# Patient Record
Sex: Male | Born: 1975 | Race: Black or African American | Hispanic: No | Marital: Married | Smoking: Current every day smoker
Health system: Southern US, Community
[De-identification: ages and names within clinical notes are randomized; demographics above are authoritative.]

## PROBLEM LIST (undated history)

## (undated) ENCOUNTER — Emergency Department: Admission: EM | Payer: No Typology Code available for payment source | Source: Home / Self Care

## (undated) DIAGNOSIS — F1410 Cocaine abuse, uncomplicated: Secondary | ICD-10-CM

---

## 2019-01-31 ENCOUNTER — Encounter: Payer: Self-pay | Admitting: Emergency Medicine

## 2019-01-31 ENCOUNTER — Emergency Department: Payer: No Typology Code available for payment source

## 2019-01-31 ENCOUNTER — Emergency Department
Admission: EM | Admit: 2019-01-31 | Discharge: 2019-01-31 | Disposition: A | Discharge status: Home / Self Care | Payer: No Typology Code available for payment source | Attending: Emergency Medicine | Admitting: Emergency Medicine

## 2019-01-31 ENCOUNTER — Other Ambulatory Visit: Payer: Self-pay

## 2019-01-31 DIAGNOSIS — Y9389 Activity, other specified: Secondary | ICD-10-CM | POA: Diagnosis not present

## 2019-01-31 DIAGNOSIS — S161XXA Strain of muscle, fascia and tendon at neck level, initial encounter: Secondary | ICD-10-CM | POA: Diagnosis not present

## 2019-01-31 DIAGNOSIS — Y999 Unspecified external cause status: Secondary | ICD-10-CM | POA: Diagnosis not present

## 2019-01-31 DIAGNOSIS — Y92410 Unspecified street and highway as the place of occurrence of the external cause: Secondary | ICD-10-CM | POA: Diagnosis not present

## 2019-01-31 DIAGNOSIS — S199XXA Unspecified injury of neck, initial encounter: Secondary | ICD-10-CM | POA: Diagnosis present

## 2019-01-31 MED ORDER — MELOXICAM 15 MG PO TABS: 15.0000 mg | ORAL_TABLET | Freq: Every day | ORAL | 2 refills | Status: AC

## 2019-01-31 MED ORDER — METHOCARBAMOL 500 MG PO TABS: 500.0000 mg | ORAL_TABLET | Freq: Four times a day (QID) | ORAL | 0 refills | Status: DC
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
## 2019-01-31 NOTE — Discharge Instructions (Signed)
Follow-up with your regular doctor or orthopedics if not better in 1 week.  Return emergency department worsening.  Take the meloxicam and Robaxin as prescribed.  He may also take Tylenol if needed.  Apply ice to all areas that hurt.  We do not want to in a narcotic at this time as it may mask any worsening symptoms.  You develop abdominal pain or chest pain you must return to the emergency department.
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
## 2020-01-12 ENCOUNTER — Other Ambulatory Visit: Payer: Self-pay

## 2020-01-12 ENCOUNTER — Emergency Department
Admission: EM | Admit: 2020-01-12 | Discharge: 2020-01-12 | Disposition: A | Discharge status: Home / Self Care | Payer: Self-pay | Attending: Emergency Medicine | Admitting: Emergency Medicine

## 2020-01-12 DIAGNOSIS — F17200 Nicotine dependence, unspecified, uncomplicated: Secondary | ICD-10-CM | POA: Diagnosis not present

## 2020-01-12 DIAGNOSIS — N454 Abscess of epididymis or testis: Secondary | ICD-10-CM | POA: Insufficient documentation

## 2020-01-12 DIAGNOSIS — N492 Inflammatory disorders of scrotum: Secondary | ICD-10-CM

## 2020-01-12 MED ORDER — SULFAMETHOXAZOLE-TRIMETHOPRIM 800-160 MG PO TABS: 1.0000 | ORAL_TABLET | Freq: Two times a day (BID) | ORAL | 0 refills | Status: DC

## 2020-01-12 MED ORDER — TAMSULOSIN HCL 0.4 MG PO CAPS: 0.4000 mg | ORAL_CAPSULE | Freq: Every day | ORAL | 0 refills | Status: AC

## 2020-01-12 NOTE — ED Triage Notes (Signed)
Pt reports abscess on scrotum that ruptured 2 days ago. Yellow drainage from area at time of rupture. Pt states region is painful.  nad noted at this time

## 2020-01-16 NOTE — ED Provider Notes (Signed)
[facility] Emergency Department Provider Note  ____________________________________________  Time seen: Approximately 1:25 PM  I have reviewed the triage vital signs and the nursing notes.   HISTORY  Chief Complaint Abscess   HPI Dylan Freeman is a 44 y.o. male who presents to the emergency department for treatment and evaluation of abscess to the scrotum. He states that the area drained spontaneously but is still very painful. No history of skin infections. No fever. He also states that he has a kidney stone and would like medication "to help it pass."    History reviewed. No pertinent past medical history.  There are no problems to display for this patient.   History reviewed. No pertinent surgical history.  Prior to Admission medications   Medication Sig Start Date End Date Taking? Authorizing Provider  meloxicam (MOBIC) 15 MG tablet Take 1 tablet (15 mg total) by mouth daily. 01/31/19 01/31/20  Fisher, Roselyn Bering, PA-C  methocarbamol (ROBAXIN) 500 MG tablet Take 1 tablet (500 mg total) by mouth 4 (four) times daily. 01/31/19   Fisher, Roselyn Bering, PA-C  sulfamethoxazole-trimethoprim (BACTRIM DS) 800-160 MG tablet Take 1 tablet by mouth 2 (two) times daily. 01/12/20   Nohemi Nicklaus, Rulon Eisenmenger B, FNP  tamsulosin (FLOMAX) 0.4 MG CAPS capsule Take 1 capsule (0.4 mg total) by mouth daily. 01/12/20   Chinita Pester, FNP    Allergies Penicillins  History reviewed. No pertinent family history.  Social History Social History   Tobacco Use   Smoking status: Current Every Day Smoker   Smokeless tobacco: Never Used  Substance Use Topics   Alcohol use: Yes   Drug use: Not Currently    Review of Systems  Constitutional: Negative for fever. Respiratory: Negative for cough or shortness of breath.  Musculoskeletal: Negative for myalgias Skin: Positive for left side scrotal pain Neurological: Negative for numbness or  paresthesias. ____________________________________________   PHYSICAL EXAM:  VITAL SIGNS: ED Triage Vitals  Enc Vitals Group     BP 01/12/20 1021 122/71     Pulse Rate 01/12/20 1021 80     Resp 01/12/20 1021 16     Temp 01/12/20 1021 98.9 F (37.2 C)     Temp Source 01/12/20 1149 Oral     SpO2 01/12/20 1021 98 %     Weight 01/12/20 1023 200 lb (90.7 kg)     Height 01/12/20 1023 5\' 11"  (1.803 m)     Head Circumference --      Peak Flow --      Pain Score 01/12/20 1022 6     Pain Loc --      Pain Edu? --      Excl. in [place]? --      Constitutional: Well appearing. Eyes: Conjunctivae are clear without discharge or drainage. Nose: No rhinorrhea noted. Mouth/Throat: Airway is patent.  Neck: No stridor. Unrestricted range of motion observed. Cardiovascular: Capillary refill is <3 seconds.  Respiratory: Respirations are even and unlabored.. Musculoskeletal: Unrestricted range of motion observed. Neurologic: Awake, alert, and oriented x 4.  Skin: Left side scrotal wall mildly erythematous and tender with open area draining yellow fluid. No fluctuance or induration.  ____________________________________________   LABS (all labs ordered are listed, but only abnormal results are displayed)  Labs Reviewed - No data to display ____________________________________________  EKG  Not indicated. ____________________________________________  RADIOLOGY  Not indicated. ____________________________________________   PROCEDURES  Procedures ____________________________________________   INITIAL IMPRESSION / ASSESSMENT AND PLAN / ED COURSE  Dylan Freeman is a 44  y.o. male presents to the ER for scrotal pain and abscess. Area appears to have nearly completely drained. No fluctuance or induration. He will be treated with Bactrim. He will also be given Flomax which may help pass kidney stone. He was encouraged to stay well hydrated and follow up with PCP for symptoms of concern. He  is to return to the ER for symptoms that change or worsen.   Medications - No data to display   Pertinent labs & imaging results that were available during my care of the patient were reviewed by me and considered in my medical decision making (see chart for details).  ____________________________________________   FINAL CLINICAL IMPRESSION(S) / ED DIAGNOSES  Final diagnoses:  Abscess of scrotal wall    ED Discharge Orders         Ordered    sulfamethoxazole-trimethoprim (BACTRIM DS) 800-160 MG tablet  2 times daily        01/12/20 1110    tamsulosin (FLOMAX) 0.4 MG CAPS capsule  Daily        01/12/20 1110           Note:  This document was prepared using Dragon voice recognition software and may include unintentional dictation errors.   Chinita Pester, FNP 01/16/20 1337    Gilles Chiquito, MD 01/19/20 2005

## 2020-11-04 ENCOUNTER — Other Ambulatory Visit: Payer: Self-pay

## 2020-11-04 ENCOUNTER — Observation Stay
Admission: EM | Admit: 2020-11-04 | Discharge: 2020-11-05 | Disposition: A | Discharge status: Home / Self Care | Payer: Self-pay | Attending: Hospitalist | Admitting: Hospitalist

## 2020-11-04 ENCOUNTER — Encounter
Admission: EM | Disposition: A | Discharge status: Home / Self Care | Payer: Self-pay | Source: Home / Self Care | Attending: Hospitalist

## 2020-11-04 DIAGNOSIS — R079 Chest pain, unspecified: Secondary | ICD-10-CM | POA: Diagnosis present

## 2020-11-04 DIAGNOSIS — F1410 Cocaine abuse, uncomplicated: Secondary | ICD-10-CM | POA: Diagnosis present

## 2020-11-04 DIAGNOSIS — Z20822 Contact with and (suspected) exposure to covid-19: Secondary | ICD-10-CM | POA: Insufficient documentation

## 2020-11-04 DIAGNOSIS — F17290 Nicotine dependence, other tobacco product, uncomplicated: Secondary | ICD-10-CM | POA: Insufficient documentation

## 2020-11-04 DIAGNOSIS — R0789 Other chest pain: Principal | ICD-10-CM | POA: Insufficient documentation

## 2020-11-04 DIAGNOSIS — Z79899 Other long term (current) drug therapy: Secondary | ICD-10-CM | POA: Insufficient documentation

## 2020-11-04 DIAGNOSIS — R072 Precordial pain: Secondary | ICD-10-CM

## 2020-11-04 HISTORY — PX: LEFT HEART CATH AND CORONARY ANGIOGRAPHY: CATH118249

## 2020-11-04 HISTORY — DX: Cocaine abuse, uncomplicated: F14.10

## 2020-11-04 HISTORY — PX: CORONARY/GRAFT ACUTE MI REVASCULARIZATION: CATH118305

## 2020-11-04 LAB — TROPONIN I (HIGH SENSITIVITY)
Troponin I (High Sensitivity): 4 ng/L (ref <18)
Troponin I (High Sensitivity): 5 ng/L (ref <18)

## 2020-11-04 LAB — CBC WITH DIFFERENTIAL/PLATELET
Abs Immature Granulocytes: 0.03 10*3/uL (ref 0.00–0.07)
Basophils Absolute: 0.1 10*3/uL (ref 0.0–0.1)
Basophils Relative: 1 %
Eosinophils Absolute: 0.4 10*3/uL (ref 0.0–0.5)
Eosinophils Relative: 5 %
HCT: 39.9 % (ref 39.0–52.0)
Hemoglobin: 13.3 g/dL (ref 13.0–17.0)
Immature Granulocytes: 0 %
Lymphocytes Relative: 30 %
Lymphs Abs: 2.4 10*3/uL (ref 0.7–4.0)
MCH: 32.1 pg (ref 26.0–34.0)
MCHC: 33.3 g/dL (ref 30.0–36.0)
MCV: 96.4 fL (ref 80.0–100.0)
Monocytes Absolute: 0.6 10*3/uL (ref 0.1–1.0)
Monocytes Relative: 7 %
Neutro Abs: 4.6 10*3/uL (ref 1.7–7.7)
Neutrophils Relative %: 57 %
Platelets: 181 10*3/uL (ref 150–400)
RBC: 4.14 MIL/uL — ABNORMAL LOW (ref 4.22–5.81)
RDW: 12.5 % (ref 11.5–15.5)
WBC: 8.1 10*3/uL (ref 4.0–10.5)
nRBC: 0 % (ref 0.0–0.2)

## 2020-11-04 LAB — COMPREHENSIVE METABOLIC PANEL
ALT: 12 U/L (ref 0–44)
AST: 22 U/L (ref 15–41)
Albumin: 4.2 g/dL (ref 3.5–5.0)
Alkaline Phosphatase: 64 U/L (ref 38–126)
Anion gap: 8 (ref 5–15)
BUN: 10 mg/dL (ref 6–20)
CO2: 24 mmol/L (ref 22–32)
Calcium: 8.7 mg/dL — ABNORMAL LOW (ref 8.9–10.3)
Chloride: 103 mmol/L (ref 98–111)
Creatinine, Ser: 1.15 mg/dL (ref 0.61–1.24)
GFR, Estimated: >60 mL/min (ref >60)
Glucose, Bld: 132 mg/dL — ABNORMAL HIGH (ref 70–99)
Potassium: 3.3 mmol/L — ABNORMAL LOW (ref 3.5–5.1)
Sodium: 135 mmol/L (ref 135–145)
Total Bilirubin: 1.6 mg/dL — ABNORMAL HIGH (ref 0.3–1.2)
Total Protein: 7.4 g/dL (ref 6.5–8.1)

## 2020-11-04 LAB — LIPID PANEL
Cholesterol: 193 mg/dL (ref 0–200)
HDL: 42 mg/dL (ref >40)
LDL Cholesterol: 136 mg/dL — ABNORMAL HIGH (ref 0–99)
Total CHOL/HDL Ratio: 4.6 RATIO
Triglycerides: 75 mg/dL (ref <150)
VLDL: 15 mg/dL (ref 0–40)

## 2020-11-04 LAB — RESP PANEL BY RT-PCR (FLU A&B, COVID) ARPGX2
Influenza A by PCR: NEGATIVE
Influenza B by PCR: NEGATIVE
SARS Coronavirus 2 by RT PCR: NEGATIVE

## 2020-11-04 LAB — PROTIME-INR
INR: 1.1 (ref 0.8–1.2)
Prothrombin Time: 14.4 s (ref 11.4–15.2)

## 2020-11-04 LAB — APTT: aPTT: 131 s — ABNORMAL HIGH (ref 24–36)

## 2020-11-04 SURGERY — CORONARY/GRAFT ACUTE MI REVASCULARIZATION
Anesthesia: Moderate Sedation

## 2020-11-04 MED ORDER — LIDOCAINE HCL (PF) 1 % IJ SOLN
INTRAMUSCULAR | Status: DC | PRN
  Administered 2020-11-04: 2 mL

## 2020-11-04 MED ORDER — IOHEXOL 300 MG/ML  SOLN
INTRAMUSCULAR | Status: DC | PRN
  Administered 2020-11-04: 80 mL

## 2020-11-04 MED ORDER — SODIUM CHLORIDE 0.9 % IV SOLN: INTRAVENOUS | Status: DC

## 2020-11-04 MED ORDER — LIDOCAINE HCL 1 % IJ SOLN
INTRAMUSCULAR | Status: AC
  Filled 2020-11-04: qty 20

## 2020-11-04 MED ORDER — FENTANYL CITRATE (PF) 100 MCG/2ML IJ SOLN
INTRAMUSCULAR | Status: AC
  Filled 2020-11-04: qty 2

## 2020-11-04 MED ORDER — FENTANYL CITRATE (PF) 100 MCG/2ML IJ SOLN
INTRAMUSCULAR | Status: DC | PRN
  Administered 2020-11-04: 50 ug via INTRAVENOUS

## 2020-11-04 MED ORDER — VERAPAMIL HCL 2.5 MG/ML IV SOLN
INTRAVENOUS | Status: DC | PRN
  Administered 2020-11-04: 2.5 mg via INTRAVENOUS

## 2020-11-04 MED ORDER — ASPIRIN 81 MG PO CHEW: 324.0000 mg | CHEWABLE_TABLET | Freq: Once | ORAL | Status: DC

## 2020-11-04 MED ORDER — HEPARIN (PORCINE) IN NACL 2000-0.9 UNIT/L-% IV SOLN
INTRAVENOUS | Status: DC | PRN
  Administered 2020-11-04: 1000 mL

## 2020-11-04 MED ORDER — HEPARIN SODIUM (PORCINE) 1000 UNIT/ML IJ SOLN
INTRAMUSCULAR | Status: DC | PRN
  Administered 2020-11-04: 5000 [IU] via INTRAVENOUS

## 2020-11-04 MED ORDER — MIDAZOLAM HCL 2 MG/2ML IJ SOLN
INTRAMUSCULAR | Status: AC
  Filled 2020-11-04: qty 2

## 2020-11-04 MED ORDER — HEPARIN SODIUM (PORCINE) 1000 UNIT/ML IJ SOLN
INTRAMUSCULAR | Status: AC
  Filled 2020-11-04: qty 1

## 2020-11-04 MED ORDER — VERAPAMIL HCL 2.5 MG/ML IV SOLN
INTRAVENOUS | Status: AC
  Filled 2020-11-04: qty 2

## 2020-11-04 MED ORDER — HEPARIN (PORCINE) IN NACL 1000-0.9 UT/500ML-% IV SOLN
INTRAVENOUS | Status: AC
  Filled 2020-11-04: qty 1000

## 2020-11-04 MED ORDER — MIDAZOLAM HCL 2 MG/2ML IJ SOLN
INTRAMUSCULAR | Status: DC | PRN
  Administered 2020-11-04: 1 mg via INTRAVENOUS

## 2020-11-04 SURGICAL SUPPLY — 16 items
CATH INFINITI 5 FR JL3.5 (CATHETERS) ×2 IMPLANT
CATH INFINITI 5FR ANG PIGTAIL (CATHETERS) ×2 IMPLANT
CATH INFINITI JR4 5F (CATHETERS) ×2 IMPLANT
DEVICE RAD TR BAND REGULAR (VASCULAR PRODUCTS) ×2 IMPLANT
DRAPE BRACHIAL (DRAPES) ×2 IMPLANT
GLIDESHEATH SLEND SS 6F .021 (SHEATH) ×2 IMPLANT
GUIDEWIRE INQWIRE 1.5J.035X260 (WIRE) ×1 IMPLANT
INQWIRE 1.5J .035X260CM (WIRE) ×2
KIT ENCORE 26 ADVANTAGE (KITS) IMPLANT
KIT SYRINGE INJ CVI SPIKEX1 (MISCELLANEOUS) ×2 IMPLANT
PACK CARDIAC CATH (CUSTOM PROCEDURE TRAY) ×2 IMPLANT
PROTECTION STATION PRESSURIZED (MISCELLANEOUS) ×2
SET ATX SIMPLICITY (MISCELLANEOUS) ×2 IMPLANT
STATION PROTECTION PRESSURIZED (MISCELLANEOUS) ×1 IMPLANT
TUBING CIL FLEX 10 FLL-RA (TUBING) ×2 IMPLANT
WIRE ASAHI PROWATER 180CM (WIRE) IMPLANT

## 2020-11-04 NOTE — Progress Notes (Signed)
Chaplain Maggie responded to Code Stemi to ED2. No family present. Spiritual and social support available per on call chaplain at [phone].

## 2020-11-04 NOTE — ED Provider Notes (Signed)
[facility] Emergency Department Provider Note  ____________________________________________   I have reviewed the triage vital signs and the nursing notes.   HISTORY  Chief Complaint Code STEMI   History limited by: Not Limited   HPI Dylan Freeman is a 45 y.o. male who presents to the emergency department today via EMS as emergency traffic for code STEMI.  Patient was at work today when he started developing chest tightness.  Located in his left center chest.  He states that it did radiate to his left arm.  He had some associated shortness of breath and felt hot as well.  He says he has had similar symptoms occasionally in the past.  He has not been evaluated for this in the past.  Patient states that he has been told he has slight high blood pressure and diabetes issues but that with dietary change he would not need to be on medication.  Says that his mother had heart disease although is unclear what that history was.  He states he does smoke and drink.  Records reviewed.    There are no problems to display for this patient.   History reviewed. No pertinent surgical history.  Prior to Admission medications   Medication Sig Start Date End Date Taking? Authorizing Provider  methocarbamol (ROBAXIN) 500 MG tablet Take 1 tablet (500 mg total) by mouth 4 (four) times daily. 01/31/19   Fisher, Roselyn Bering, PA-C  sulfamethoxazole-trimethoprim (BACTRIM DS) 800-160 MG tablet Take 1 tablet by mouth 2 (two) times daily. 01/12/20   Triplett, Rulon Eisenmenger B, FNP  tamsulosin (FLOMAX) 0.4 MG CAPS capsule Take 1 capsule (0.4 mg total) by mouth daily. 01/12/20   Chinita Pester, FNP    Allergies Penicillins  History reviewed. No pertinent family history.  Social History Social History   Tobacco Use   Smoking status: Every Day   Smokeless tobacco: Never  Substance Use Topics   Alcohol use: Yes   Drug use: Yes    Types: Marijuana    Review of Systems Constitutional: No  fever/chills Eyes: No visual changes. ENT: No sore throat. Cardiovascular: Positive for chest tightness. Respiratory: Positive for shortness of breath. Gastrointestinal: No abdominal pain.  No nausea, no vomiting.  No diarrhea.   Genitourinary: Negative for dysuria. Musculoskeletal: Negative for back pain. Skin: Negative for rash. Neurological: Negative for headaches, focal weakness or numbness.  ____________________________________________   PHYSICAL EXAM:  VITAL SIGNS: ED Triage Vitals  Enc Vitals Group     BP 11/04/20 2059 126/82     Pulse Rate 11/04/20 2059 75     Resp 11/04/20 2059 10     Temp 11/04/20 2106 98.7 F (37.1 C)     Temp Source 11/04/20 2106 Oral     SpO2 11/04/20 2059 99 %     Weight 11/04/20 2105 211 lb 1.6 oz (95.8 kg)     Height 11/04/20 2104 5\' 11"  (1.803 m)     Head Circumference --      Peak Flow --      Pain Score 11/04/20 2103 5   Constitutional: Alert and oriented.  Eyes: Conjunctivae are normal.  ENT      Head: Normocephalic and atraumatic.      Nose: No congestion/rhinnorhea.      Mouth/Throat: Mucous membranes are moist.      Neck: No stridor. Hematological/Lymphatic/Immunilogical: No cervical lymphadenopathy. Cardiovascular: Normal rate, regular rhythm.  No murmurs, rubs, or gallops.  Respiratory: Normal respiratory effort without tachypnea nor retractions. Breath sounds  are clear and equal bilaterally. No wheezes/rales/rhonchi. Gastrointestinal: Soft and non tender. No rebound. No guarding.  Genitourinary: Deferred Musculoskeletal: Normal range of motion in all extremities. No lower extremity edema. Neurologic:  Normal speech and language. No gross focal neurologic deficits are appreciated.  Skin:  Skin is warm, dry and intact. No rash noted. Psychiatric: Mood and affect are normal. Speech and behavior are normal. Patient exhibits appropriate insight and judgment.  ____________________________________________    LABS (pertinent  positives/negatives)  Pending at time of admission  ____________________________________________   EKG  I, Phineas Semen, attending physician, personally viewed and interpreted this EKG  EKG Time: 2103 Rate: 75 Rhythm: sinus rhythm Axis: normal Intervals: qtc 429 QRS: narrow ST changes: st elevation II, III, aVF Impression: abnormal ekg   ____________________________________________    RADIOLOGY  None  ____________________________________________   PROCEDURES  Procedures  CRITICAL CARE Performed by: Phineas Semen   Total critical care time: 15 minutes  Critical care time was exclusive of separately billable procedures and treating other patients.  Critical care was necessary to treat or prevent imminent or life-threatening deterioration.  Critical care was time spent personally by me on the following activities: development of treatment plan with patient and/or surrogate as well as nursing, discussions with consultants, evaluation of patient's response to treatment, examination of patient, obtaining history from patient or surrogate, ordering and performing treatments and interventions, ordering and review of laboratory studies, ordering and review of radiographic studies, pulse oximetry and re-evaluation of patient's condition.  ____________________________________________   INITIAL IMPRESSION / ASSESSMENT AND PLAN / ED COURSE  Pertinent labs & imaging results that were available during my care of the patient were reviewed by me and considered in my medical decision making (see chart for details).   Patient presented to the emergency department today because of concerns for chest pain.  Patient did come in and set a code STEMI.  Dr. Cassie Freer with cardiology did evaluate the patient here in the emergency department.  Did take the patient emergently to Cath Lab.   ____________________________________________   FINAL CLINICAL IMPRESSION(S) / ED  DIAGNOSES  Final diagnoses:  Chest pain of uncertain etiology     Note: This dictation was prepared with Dragon dictation. Any transcriptional errors that result from this process are unintentional     Phineas Semen, MD 11/04/20 2313

## 2020-11-04 NOTE — Plan of Care (Signed)
  Problem: Clinical Measurements: Goal: Diagnostic test results will improve Outcome: Progressing Goal: Cardiovascular complication will be avoided Outcome: Progressing   Problem: Pain Managment: Goal: General experience of comfort will improve Outcome: Progressing   

## 2020-11-04 NOTE — ED Triage Notes (Signed)
Pt BIBA from home c/o sudden onset midsternal chest pain at 2015 today. Pt describes as pressure 8/10. EMS gave 4 ASA and nitro at 2032 and 500cc NS. Pt felt some relief but pain it still present on arrival to ED. Pt A+O x4 on arrival .

## 2020-11-04 NOTE — Consult Note (Signed)
[facility] Cardiology  CARDIOLOGY CONSULT NOTE  Patient ID: Dylan Freeman MRN: [id number] DOB/AGE: [birth year] 45 y.o.  Admit date: 11/04/2020 Referring Physician Derrill Kay Primary Physician  Primary Cardiologist  Reason for Consultation chest pain  HPI: 45 year old gentleman referred for evaluation of chest pain and ECG worrisome for inferior and anterolateral ST elevation MI.  Patient was usual state of health until earlier this evening when he developed substernal chest pain.  EMS was called and initial EKG revealed ST elevations in the inferior and anterolateral leads.  In the ED, patient was still complaining of chest pain.  Repeat ECG again revealed ST elevation in the inferior and anterolateral leads.  Patient underwent emergent cardiac catheterization which revealed normal coronary anatomy and normal left ventricular function.  Review of systems complete and found to be negative unless listed above     History reviewed. No pertinent past medical history.  History reviewed. No pertinent surgical history.  Medications Prior to Admission  Medication Sig Dispense Refill Last Dose   methocarbamol (ROBAXIN) 500 MG tablet Take 1 tablet (500 mg total) by mouth 4 (four) times daily. 28 tablet 0    sulfamethoxazole-trimethoprim (BACTRIM DS) 800-160 MG tablet Take 1 tablet by mouth 2 (two) times daily. 20 tablet 0    tamsulosin (FLOMAX) 0.4 MG CAPS capsule Take 1 capsule (0.4 mg total) by mouth daily. 14 capsule 0    Social History   Socioeconomic History   Marital status: Married    Spouse name: Not on file   Number of children: Not on file   Years of education: Not on file   Highest education level: Not on file  Occupational History   Not on file  Tobacco Use   Smoking status: Every Day   Smokeless tobacco: Never  Substance and Sexual Activity   Alcohol use: Yes   Drug use: Yes    Types: Marijuana   Sexual activity: Not on file  Other Topics Concern   Not on file  Social History  Narrative   Not on file   Social Determinants of Health   Financial Resource Strain: Not on file  Food Insecurity: Not on file  Transportation Needs: Not on file  Physical Activity: Not on file  Stress: Not on file  Social Connections: Not on file  Intimate Partner Violence: Not on file    History reviewed. No pertinent family history.    Review of systems complete and found to be negative unless listed above      PHYSICAL EXAM  General: Well developed, well nourished, in no acute distress HEENT:  Normocephalic and atramatic Neck:  No JVD.  Lungs: Clear bilaterally to auscultation and percussion. Heart: HRRR . Normal S1 and S2 without gallops or murmurs.  Abdomen: Bowel sounds are positive, abdomen soft and non-tender  Msk:  Back normal, normal gait. Normal strength and tone for age. Extremities: No clubbing, cyanosis or edema.   Neuro: Alert and oriented X 3. Psych:  Good affect, responds appropriately  Labs:  No results found for: WBC, HGB, HCT, MCV, PLT No results for input(s): NA, K, CL, CO2, BUN, CREATININE, CALCIUM, PROT, BILITOT, ALKPHOS, ALT, AST, GLUCOSE in the last 168 hours.  Invalid input(s): LABALBU No results found for: CKTOTAL, CKMB, CKMBINDEX, TROPONINI No results found for: CHOL No results found for: HDL No results found for: LDLCALC No results found for: TRIG No results found for: CHOLHDL No results found for: LDLDIRECT    Radiology: CARDIAC CATHETERIZATION  Result Date: 11/04/2020   The  left ventricular systolic function is normal.   The left ventricular ejection fraction is 55-65% by visual estimate. 1.  Normal coronary anatomy 2.  Normal left ventricular function    EKG: Sinus rhythm with ST elevation in leads II, III and aVF, 1 and aVL, V3 through V6  ASSESSMENT AND PLAN:   1.  Chest pain, with abnormal ECG, emergent cardiac catheterization reveals normal coronary anatomy and normal left ventricular function 2.  Tobacco  abuse  Recommendations  1.  Start low-dose aspirin 81 mg daily 2.  Chest x-ray 3.  Observe at least overnight for recurrent chest pain which is likely noncardiac in origin 4.  Strongly advised patient to stop smoking  Signed: Marcina Millard MD,PhD, [facility] 11/04/2020, 10:00 PM

## 2020-11-04 NOTE — H&P (Signed)
History and Physical    Dylan Freeman ZOX:[id number] DOB: 1975/08/29 DOA: 11/04/2020  PCP: Patient, No Pcp Per (Inactive)   Patient coming from: cath lab  I have personally briefly reviewed patient's old medical records in [facility]  Chief Complaint: Chest pain  HPI: Dylan Freeman is a 45 y.o. male  with no significant past medical history who arrived by EMS as a code STEMI after developing severe retrosternal and precordial chest tightness radiating to his left arm associated with shortness of breath and diaphoresis.   He was taken to the Cath Lab by cardiologist Dr. Darrold Junker with finding of clean coronaries.  Patient had an episode of similar chest pain in 2015 with negative work-up in the ED.  Patient continues to have chest discomfort described as pressure, nonradiating 2-3 out of 10 but without shortness of breath.  He denies leg pain or swelling, recent long car travel. Patient states he drinks alcohol about 4 days out of the week and has been advised by his wife to cut back.  Also does marijuana.  Has snorted cocaine occasionally, last time was 2 to 3 weeks ago.  Post-cath vitals: BP 132/80, pulse 76 O2 sat 100% on room air  Labs: Still pending from arrival as they had to be recollected COVID and flu negative  Cardiac cath report: Normal coronary anatomy with normal left ventricular function  Review of Systems: As per HPI otherwise all other systems on review of systems negative.    History reviewed. No pertinent past medical history.  History reviewed. No pertinent surgical history.   reports that he has been smoking cigars. He has never used smokeless tobacco. He reports current alcohol use of about 16.0 standard drinks per week. He reports current drug use. Drugs: Marijuana and Cocaine.  Allergies  Allergen Reactions   Penicillins Rash    Family History  Problem Relation Age of Onset   Heart attack Mother       Prior to Admission medications   Medication Sig  Start Date End Date Taking? Authorizing Provider  methocarbamol (ROBAXIN) 500 MG tablet Take 1 tablet (500 mg total) by mouth 4 (four) times daily. 01/31/19   Fisher, Roselyn Bering, PA-C  sulfamethoxazole-trimethoprim (BACTRIM DS) 800-160 MG tablet Take 1 tablet by mouth 2 (two) times daily. 01/12/20   Triplett, Rulon Eisenmenger B, FNP  tamsulosin (FLOMAX) 0.4 MG CAPS capsule Take 1 capsule (0.4 mg total) by mouth daily. 01/12/20   Chinita Pester, FNP    Physical Exam: Vitals:   11/04/20 2104 11/04/20 2105 11/04/20 2106 11/04/20 2123  BP:      Pulse:      Resp:      Temp:   98.7 F (37.1 C)   TempSrc:   Oral   SpO2:    95%  Weight:  95.8 kg    Height: 5\' 11"  (1.803 m)        Vitals:   11/04/20 2104 11/04/20 2105 11/04/20 2106 11/04/20 2123  BP:      Pulse:      Resp:      Temp:   98.7 F (37.1 C)   TempSrc:   Oral   SpO2:    95%  Weight:  95.8 kg    Height: 5\' 11"  (1.803 m)         Constitutional: Somewhat ill-appearing, oriented x 3 . Not in any apparent distress HEENT:      Head: Normocephalic and atraumatic.         Eyes:  PERLA, EOMI, Conjunctivae are normal. Sclera is non-icteric.       Mouth/Throat: Mucous membranes are moist.       Neck: Supple with no signs of meningismus. Cardiovascular: Regular rate and rhythm. No murmurs, gallops, or rubs. 2+ symmetrical distal pulses are present . No JVD. No LE edema Respiratory: Respiratory effort normal .Lungs sounds clear bilaterally. No wheezes, crackles, or rhonchi.  Gastrointestinal: Soft, non tender, and non distended with positive bowel sounds.  Genitourinary: No CVA tenderness. Musculoskeletal: Nontender with normal range of motion in all extremities. No cyanosis, or erythema of extremities. Neurologic:  Face is symmetric. Moving all extremities. No gross focal neurologic deficits . Skin: Skin is warm, dry.  No rash or ulcers Psychiatric: Has a depressed affect   Labs on Admission: I have personally reviewed following labs and  imaging studies  CBC: No results for input(s): WBC, NEUTROABS, HGB, HCT, MCV, PLT in the last 168 hours. Basic Metabolic Panel: Recent Labs  Lab 11/04/20 2108  NA 135  K 3.3*  CL 103  CO2 24  GLUCOSE 132*  BUN 10  CREATININE 1.15  CALCIUM 8.7*   GFR: Estimated Creatinine Clearance: 97.8 mL/min (by C-G formula based on SCr of 1.15 mg/dL). Liver Function Tests: Recent Labs  Lab 11/04/20 2108  AST 22  ALT 12  ALKPHOS 64  BILITOT 1.6*  PROT 7.4  ALBUMIN 4.2   No results for input(s): LIPASE, AMYLASE in the last 168 hours. No results for input(s): AMMONIA in the last 168 hours. Coagulation Profile: No results for input(s): INR, PROTIME in the last 168 hours. Cardiac Enzymes: No results for input(s): CKTOTAL, CKMB, CKMBINDEX, TROPONINI in the last 168 hours. BNP (last 3 results) No results for input(s): PROBNP in the last 8760 hours. HbA1C: No results for input(s): HGBA1C in the last 72 hours. CBG: No results for input(s): GLUCAP in the last 168 hours. Lipid Profile: Recent Labs    11/04/20 2108  CHOL 193  HDL 42  LDLCALC 136*  TRIG 75  CHOLHDL 4.6   Thyroid Function Tests: No results for input(s): TSH, T4TOTAL, FREET4, T3FREE, THYROIDAB in the last 72 hours. Anemia Panel: No results for input(s): VITAMINB12, FOLATE, FERRITIN, TIBC, IRON, RETICCTPCT in the last 72 hours. Urine analysis: No results found for: COLORURINE, APPEARANCEUR, LABSPEC, PHURINE, GLUCOSEU, HGBUR, BILIRUBINUR, KETONESUR, PROTEINUR, UROBILINOGEN, NITRITE, LEUKOCYTESUR  Radiological Exams on Admission: CARDIAC CATHETERIZATION  Result Date: 11/04/2020   The left ventricular systolic function is normal.   The left ventricular ejection fraction is 55-65% by visual estimate. 1.  Normal coronary anatomy 2.  Normal left ventricular function     Assessment/Plan 45 year old male with no significant past medical history presenting as code STEMI, ruled out with cardiac cath that showed normal  coronaries    Chest pain, noncardiac - Negative cardiac cath - Blood work returned unremarkable with troponin 4-5,Potassium 3.3, glucose 132, LDL 136 - Etiology includes musculoskeletal, GI,  - D-dimer and chest x-ray - Observe overnight  Nicotine dependence - Counseled on tobacco cessation  Alcohol use - Counseled on abstinence.  States wife has been talking to him about cutting back and he has been trying - CIWA withdrawal protocol  Marijuana use - Counseled on cutting back/cessation  History of cocaine use - Counseled on complete abstinence    DVT prophylaxis: Lovenox  Code Status: full code  Family Communication:  none  Disposition Plan: Back to previous home environment Consults called: none  Status:observation    Andris Baumann MD [facility]     11/04/2020, 10:10 PM

## 2020-11-05 ENCOUNTER — Observation Stay: Payer: Self-pay

## 2020-11-05 ENCOUNTER — Encounter: Payer: Self-pay | Admitting: Cardiology

## 2020-11-05 LAB — URINE DRUG SCREEN, QUALITATIVE (ARMC ONLY)
Amphetamines, Ur Screen: NOT DETECTED
Barbiturates, Ur Screen: NOT DETECTED
Benzodiazepine, Ur Scrn: POSITIVE — AB
Cannabinoid 50 Ng, Ur ~~LOC~~: POSITIVE — AB
Cocaine Metabolite,Ur ~~LOC~~: POSITIVE — AB
MDMA (Ecstasy)Ur Screen: NOT DETECTED
Methadone Scn, Ur: NOT DETECTED
Opiate, Ur Screen: NOT DETECTED
Phencyclidine (PCP) Ur S: NOT DETECTED
Tricyclic, Ur Screen: NOT DETECTED

## 2020-11-05 LAB — D-DIMER, QUANTITATIVE: D-Dimer, Quant: 0.29 ug/mL-FEU (ref 0.00–0.50)

## 2020-11-05 LAB — HIV ANTIBODY (ROUTINE TESTING W REFLEX): HIV Screen 4th Generation wRfx: NONREACTIVE

## 2020-11-05 MED ORDER — LABETALOL HCL 5 MG/ML IV SOLN: 10.0000 mg | INTRAVENOUS | Status: AC | PRN

## 2020-11-05 MED ORDER — SODIUM CHLORIDE 0.9 % IV SOLN: 250.0000 mL | INTRAVENOUS | Status: DC | PRN

## 2020-11-05 MED ORDER — SODIUM CHLORIDE 0.9 % WEIGHT BASED INFUSION
1.0000 mL/kg/h | INTRAVENOUS | Status: DC
  Administered 2020-11-05: 1 mL/kg/h via INTRAVENOUS

## 2020-11-05 MED ORDER — SODIUM CHLORIDE 0.9% FLUSH
3.0000 mL | Freq: Two times a day (BID) | INTRAVENOUS | Status: DC
  Administered 2020-11-05: 3 mL via INTRAVENOUS

## 2020-11-05 MED ORDER — ACETAMINOPHEN 325 MG PO TABS: 650.0000 mg | ORAL_TABLET | ORAL | Status: DC | PRN

## 2020-11-05 MED ORDER — ACETAMINOPHEN 650 MG RE SUPP: 650.0000 mg | Freq: Four times a day (QID) | RECTAL | Status: DC | PRN

## 2020-11-05 MED ORDER — ONDANSETRON HCL 4 MG/2ML IJ SOLN: 4.0000 mg | Freq: Four times a day (QID) | INTRAMUSCULAR | Status: DC | PRN

## 2020-11-05 MED ORDER — ASPIRIN 81 MG PO CHEW
81.0000 mg | CHEWABLE_TABLET | Freq: Every day | ORAL | Status: DC
  Administered 2020-11-05: 81 mg via ORAL
  Filled 2020-11-05: qty 1

## 2020-11-05 MED ORDER — HYDRALAZINE HCL 20 MG/ML IJ SOLN: 10.0000 mg | INTRAMUSCULAR | Status: AC | PRN

## 2020-11-05 MED ORDER — MORPHINE SULFATE (PF) 2 MG/ML IV SOLN
2.0000 mg | INTRAVENOUS | Status: DC | PRN
Start: 2020-11-05 — End: 2020-11-05

## 2020-11-05 MED ORDER — SODIUM CHLORIDE 0.9% FLUSH: 3.0000 mL | INTRAVENOUS | Status: DC | PRN

## 2020-11-05 MED ORDER — ONDANSETRON HCL 4 MG PO TABS: 4.0000 mg | ORAL_TABLET | Freq: Four times a day (QID) | ORAL | Status: DC | PRN

## 2020-11-05 MED ORDER — BUTALBITAL-APAP-CAFFEINE 50-325-40 MG PO TABS
2.0000 | ORAL_TABLET | Freq: Once | ORAL | Status: DC
  Filled 2020-11-05: qty 2

## 2020-11-05 MED ORDER — ACETAMINOPHEN 325 MG PO TABS: 650.0000 mg | ORAL_TABLET | Freq: Four times a day (QID) | ORAL | Status: DC | PRN

## 2020-11-05 MED ORDER — ENOXAPARIN SODIUM 40 MG/0.4ML IJ SOSY
40.0000 mg | PREFILLED_SYRINGE | INTRAMUSCULAR | Status: DC
  Administered 2020-11-05: 40 mg via SUBCUTANEOUS
  Filled 2020-11-05: qty 0.4

## 2020-11-05 MED ORDER — METHOCARBAMOL 500 MG PO TABS: 500.0000 mg | ORAL_TABLET | Freq: Four times a day (QID) | ORAL | 0 refills | Status: AC | PRN

## 2020-11-05 MED ORDER — ASPIRIN 81 MG PO CHEW: 81.0000 mg | CHEWABLE_TABLET | Freq: Every day | ORAL | Status: AC

## 2020-11-05 MED ORDER — HYDROCODONE-ACETAMINOPHEN 5-325 MG PO TABS: 1.0000 | ORAL_TABLET | ORAL | Status: DC | PRN

## 2020-11-05 NOTE — Discharge Summary (Signed)
Physician Discharge Summary   Dylan Freeman  male DOB: [birth year]  VHQ:[id number]  PCP: Patient, No Pcp Per (Inactive)  Admit date: 11/04/2020 Discharge date: 11/05/2020  Admitted From: home Disposition:  home Wife updated at bedside prior to discharge.  CODE STATUS: Full code  Discharge Instructions     No wound care   Complete by: As directed         Hospital Course:  For full details, please see H&P, progress notes, consult notes and ancillary notes.  Briefly,  Dylan Freeman is a 45 y.o. male  with history significant for cocaine use who arrived by EMS as a code STEMI after developing severe retrosternal and precordial chest tightness radiating to his left arm associated with shortness of breath and diaphoresis.   He was taken to the Cath Lab by cardiologist Dr. Darrold Junker with finding of clean coronaries.  Patient had an episode of similar chest pain in 2015 with negative work-up in the ED.    Patient stated he drinks alcohol about 4 days out of the week and has been advised by his wife to cut back.  Also does marijuana.  Has snorted cocaine occasionally, last time was 2 to 3 weeks ago.  Chest pain, likely 2/2 cocaine-induced vasoconstriction  History of cocaine use - Negative cardiac cath - Blood work returned unremarkable with troponin 4-5 --Pt denied recent cocaine use, however, UDS pos for cocaine. --Advised pt and wife that cocaine can cause chest pain, constructing coronary arteries, and eventually can damage the heart.   Nicotine dependence - Counseled on tobacco cessation  Alcohol use - Counseled on abstinence.    Marijuana use - Counseled on cutting back/cessation   Discharge Diagnoses:  Active Problems:   Chest pain   Discharge Instructions:  Allergies as of 11/05/2020       Reactions   Penicillins Rash        Medication List     STOP taking these medications    sulfamethoxazole-trimethoprim 800-160 MG tablet Commonly known as: BACTRIM  DS       TAKE these medications    aspirin 81 MG chewable tablet Chew 1 tablet (81 mg total) by mouth daily. Start taking on: November 06, 2020   methocarbamol 500 MG tablet Commonly known as: Robaxin Take 1 tablet (500 mg total) by mouth every 6 (six) hours as needed for muscle spasms. Home med. What changed:  when to take this reasons to take this additional instructions   tamsulosin 0.4 MG Caps capsule Commonly known as: FLOMAX Take 1 capsule (0.4 mg total) by mouth daily.          Allergies  Allergen Reactions   Penicillins Rash     The results of significant diagnostics from this hospitalization (including imaging, microbiology, ancillary and laboratory) are listed below for reference.   Consultations:   Procedures/Studies: CARDIAC CATHETERIZATION  Result Date: 11/04/2020   The left ventricular systolic function is normal.   The left ventricular ejection fraction is 55-65% by visual estimate. 1.  Normal coronary anatomy 2.  Normal left ventricular function   DG Chest Port 1 View  Result Date: 11/05/2020 CLINICAL DATA:  Chest pain EXAM: PORTABLE CHEST 1 VIEW COMPARISON:  None. FINDINGS: The heart size and mediastinal contours are within normal limits. Both lungs are clear. The visualized skeletal structures are unremarkable. IMPRESSION: No active disease. Electronically Signed   By: Deatra Robinson M.D.   On: 11/05/2020 02:33      Labs: BNP (last 3 results)  No results for input(s): BNP in the last 8760 hours. Basic Metabolic Panel: Recent Labs  Lab 11/04/20 2108  NA 135  K 3.3*  CL 103  CO2 24  GLUCOSE 132*  BUN 10  CREATININE 1.15  CALCIUM 8.7*   Liver Function Tests: Recent Labs  Lab 11/04/20 2108  AST 22  ALT 12  ALKPHOS 64  BILITOT 1.6*  PROT 7.4  ALBUMIN 4.2   No results for input(s): LIPASE, AMYLASE in the last 168 hours. No results for input(s): AMMONIA in the last 168 hours. CBC: Recent Labs  Lab 11/04/20 2320  WBC 8.1   NEUTROABS 4.6  HGB 13.3  HCT 39.9  MCV 96.4  PLT 181   Cardiac Enzymes: No results for input(s): CKTOTAL, CKMB, CKMBINDEX, TROPONINI in the last 168 hours. BNP: Invalid input(s): POCBNP CBG: No results for input(s): GLUCAP in the last 168 hours. D-Dimer Recent Labs    11/04/20 2320  DDIMER 0.29   Hgb A1c No results for input(s): HGBA1C in the last 72 hours. Lipid Profile Recent Labs    11/04/20 2108  CHOL 193  HDL 42  LDLCALC 136*  TRIG 75  CHOLHDL 4.6   Thyroid function studies No results for input(s): TSH, T4TOTAL, T3FREE, THYROIDAB in the last 72 hours.  Invalid input(s): FREET3 Anemia work up No results for input(s): VITAMINB12, FOLATE, FERRITIN, TIBC, IRON, RETICCTPCT in the last 72 hours. Urinalysis No results found for: COLORURINE, APPEARANCEUR, LABSPEC, PHURINE, GLUCOSEU, HGBUR, BILIRUBINUR, KETONESUR, PROTEINUR, UROBILINOGEN, NITRITE, LEUKOCYTESUR Sepsis Labs Invalid input(s): PROCALCITONIN,  WBC,  LACTICIDVEN Microbiology Recent Results (from the past 240 hour(s))  Resp Panel by RT-PCR (Flu A&B, Covid) Nasopharyngeal Swab     Status: None   Collection Time: 11/04/20  9:08 PM   Specimen: Nasopharyngeal Swab; Nasopharyngeal(NP) swabs in vial transport medium  Result Value Ref Range Status   SARS Coronavirus 2 by RT PCR NEGATIVE NEGATIVE Final    Comment: (NOTE) SARS-CoV-2 target nucleic acids are NOT DETECTED.  The SARS-CoV-2 RNA is generally detectable in upper respiratory specimens during the acute phase of infection. The lowest concentration of SARS-CoV-2 viral copies this assay can detect is 138 copies/mL. A negative result does not preclude SARS-Cov-2 infection and should not be used as the sole basis for treatment or other patient management decisions. A negative result may occur with  improper specimen collection/handling, submission of specimen other than nasopharyngeal swab, presence of viral mutation(s) within the areas targeted by this  assay, and inadequate number of viral copies(<138 copies/mL). A negative result must be combined with clinical observations, patient history, and epidemiological information. The expected result is Negative.  Fact Sheet for Patients:  BloggerCourse.com  Fact Sheet for Healthcare Providers:  SeriousBroker.it  This test is no t yet approved or cleared by the [place] FDA and  has been authorized for detection and/or diagnosis of SARS-CoV-2 by FDA under an Emergency Use Authorization (EUA). This EUA will remain  in effect (meaning this test can be used) for the duration of the COVID-19 declaration under Section 564(b)(1) of the Act, 21 U.S.C.section 360bbb-3(b)(1), unless the authorization is terminated  or revoked sooner.       Influenza A by PCR NEGATIVE NEGATIVE Final   Influenza B by PCR NEGATIVE NEGATIVE Final    Comment: (NOTE) The Xpert Xpress SARS-CoV-2/FLU/RSV plus assay is intended as an aid in the diagnosis of influenza from Nasopharyngeal swab specimens and should not be used as a sole basis for treatment. Nasal washings and aspirates are  unacceptable for Xpert Xpress SARS-CoV-2/FLU/RSV testing.  Fact Sheet for Patients: BloggerCourse.com  Fact Sheet for Healthcare Providers: SeriousBroker.it  This test is not yet approved or cleared by the [place] FDA and has been authorized for detection and/or diagnosis of SARS-CoV-2 by FDA under an Emergency Use Authorization (EUA). This EUA will remain in effect (meaning this test can be used) for the duration of the COVID-19 declaration under Section 564(b)(1) of the Act, 21 U.S.C. section 360bbb-3(b)(1), unless the authorization is terminated or revoked.  Performed at [facility], [address] [place], [place] [address]      Total time spend on discharging this patient, including the last patient  exam, discussing the hospital stay, instructions for ongoing care as it relates to all pertinent caregivers, as well as preparing the medical discharge records, prescriptions, and/or referrals as applicable, is 45 minutes.    Darlin Priestly, MD  [facility] 11/05/2020, 11:13 AM

## 2020-11-05 NOTE — Progress Notes (Signed)
[facility]  SUBJECTIVE: Patient laying in bed, denies chest pain or shortness of breath   Vitals:   11/04/20 2106 11/04/20 2123 11/04/20 2200 11/05/20 0111  BP:   137/83 108/75  Pulse:   69 64  Resp:   14 17  Temp: 98.7 F (37.1 C)  97.7 F (36.5 C) 97.9 F (36.6 C)  TempSrc: Oral  Oral   SpO2:  95% 100% 100%  Weight:      Height:         Intake/Output Summary (Last 24 hours) at 11/05/2020 0817 Last data filed at 11/05/2020 0130 Gross per 24 hour  Intake 120 ml  Output 500 ml  Net -380 ml      PHYSICAL EXAM  General: Well developed, well nourished, in no acute distress HEENT:  Normocephalic and atramatic Neck:  No JVD.  Lungs: Clear bilaterally to auscultation and percussion. Heart: HRRR . Normal S1 and S2 without gallops or murmurs.  Abdomen: Bowel sounds are positive, abdomen soft and non-tender  Msk:  Back normal, normal gait. Normal strength and tone for age. Extremities: No clubbing, cyanosis or edema.   Neuro: Alert and oriented X 3. Psych:  Good affect, responds appropriately   LABS: Basic Metabolic Panel: Recent Labs    11/04/20 2108  NA 135  K 3.3*  CL 103  CO2 24  GLUCOSE 132*  BUN 10  CREATININE 1.15  CALCIUM 8.7*   Liver Function Tests: Recent Labs    11/04/20 2108  AST 22  ALT 12  ALKPHOS 64  BILITOT 1.6*  PROT 7.4  ALBUMIN 4.2   No results for input(s): LIPASE, AMYLASE in the last 72 hours. CBC: Recent Labs    11/04/20 2320  WBC 8.1  NEUTROABS 4.6  HGB 13.3  HCT 39.9  MCV 96.4  PLT 181   Cardiac Enzymes: No results for input(s): CKTOTAL, CKMB, CKMBINDEX, TROPONINI in the last 72 hours. BNP: Invalid input(s): POCBNP D-Dimer: Recent Labs    11/04/20 2320  DDIMER 0.29   Hemoglobin A1C: No results for input(s): HGBA1C in the last 72 hours. Fasting Lipid Panel: Recent Labs    11/04/20 2108  CHOL 193  HDL 42  LDLCALC 136*  TRIG 75  CHOLHDL 4.6   Thyroid Function Tests: No results for input(s): TSH,  T4TOTAL, T3FREE, THYROIDAB in the last 72 hours.  Invalid input(s): FREET3 Anemia Panel: No results for input(s): VITAMINB12, FOLATE, FERRITIN, TIBC, IRON, RETICCTPCT in the last 72 hours.  CARDIAC CATHETERIZATION  Result Date: 11/04/2020   The left ventricular systolic function is normal.   The left ventricular ejection fraction is 55-65% by visual estimate. 1.  Normal coronary anatomy 2.  Normal left ventricular function   DG Chest Port 1 View  Result Date: 11/05/2020 CLINICAL DATA:  Chest pain EXAM: PORTABLE CHEST 1 VIEW COMPARISON:  None. FINDINGS: The heart size and mediastinal contours are within normal limits. Both lungs are clear. The visualized skeletal structures are unremarkable. IMPRESSION: No active disease. Electronically Signed   By: Deatra Robinson M.D.   On: 11/05/2020 02:33     Echo   TELEMETRY: Sinus rhythm at 65 bpm:  ASSESSMENT AND PLAN:  Active Problems:   Chest pain    1. Chest pain, with abnormal ECG, emergent cardiac catheterization reveals normal coronary anatomy and normal left ventricular function, high-sensitivity troponin normal x2, no recurrent chest pain 2.  Tobacco abuse 3.  Elevated blood pressure, normal today  Recommendations  1.  Agree with current therapy 2.  Continue low-dose aspirin 3.  Defer further cardiac diagnostics at this time 4.  Discharge home later today       Marcina Millard, MD, PhD, [facility] 11/05/2020 8:17 AM

## 2020-11-06 LAB — HEMOGLOBIN A1C
Hgb A1c MFr Bld: 4.9 % (ref 4.8–5.6)
Mean Plasma Glucose: 93.93 mg/dL

## 2020-11-07 ENCOUNTER — Encounter: Payer: Self-pay | Admitting: Cardiology

## 2020-11-07 LAB — GLUCOSE, CAPILLARY: Glucose-Capillary: 98 mg/dL (ref 70–99)

## 2020-11-10 ENCOUNTER — Encounter: Payer: Self-pay | Admitting: Hospitalist

## 2020-11-10 ENCOUNTER — Encounter: Payer: Self-pay | Admitting: Cardiology

## 2020-11-10 DIAGNOSIS — F1410 Cocaine abuse, uncomplicated: Secondary | ICD-10-CM | POA: Insufficient documentation

## 2021-01-10 ENCOUNTER — Encounter: Payer: Self-pay | Admitting: Emergency Medicine

## 2021-01-10 ENCOUNTER — Emergency Department: Payer: Self-pay

## 2021-01-10 DIAGNOSIS — L03314 Cellulitis of groin: Secondary | ICD-10-CM | POA: Insufficient documentation

## 2021-01-10 DIAGNOSIS — Z7982 Long term (current) use of aspirin: Secondary | ICD-10-CM | POA: Insufficient documentation

## 2021-01-10 DIAGNOSIS — Z79899 Other long term (current) drug therapy: Secondary | ICD-10-CM | POA: Insufficient documentation

## 2021-01-10 DIAGNOSIS — F17290 Nicotine dependence, other tobacco product, uncomplicated: Secondary | ICD-10-CM | POA: Insufficient documentation

## 2021-01-10 LAB — COMPREHENSIVE METABOLIC PANEL
ALT: 13 U/L (ref 0–44)
AST: 19 U/L (ref 15–41)
Albumin: 3.9 g/dL (ref 3.5–5.0)
Alkaline Phosphatase: 64 U/L (ref 38–126)
Anion gap: 6 (ref 5–15)
BUN: 11 mg/dL (ref 6–20)
CO2: 23 mmol/L (ref 22–32)
Calcium: 8.5 mg/dL — ABNORMAL LOW (ref 8.9–10.3)
Chloride: 106 mmol/L (ref 98–111)
Creatinine, Ser: 0.85 mg/dL (ref 0.61–1.24)
GFR, Estimated: >60 mL/min (ref >60)
Glucose, Bld: 117 mg/dL — ABNORMAL HIGH (ref 70–99)
Potassium: 3.6 mmol/L (ref 3.5–5.1)
Sodium: 135 mmol/L (ref 135–145)
Total Bilirubin: 1.3 mg/dL — ABNORMAL HIGH (ref 0.3–1.2)
Total Protein: 7.9 g/dL (ref 6.5–8.1)

## 2021-01-10 LAB — PROTIME-INR
INR: 1 (ref 0.8–1.2)
Prothrombin Time: 13.7 seconds (ref 11.4–15.2)

## 2021-01-10 LAB — CBC WITH DIFFERENTIAL/PLATELET
Abs Immature Granulocytes: 0.03 10*3/uL (ref 0.00–0.07)
Basophils Absolute: 0.1 10*3/uL (ref 0.0–0.1)
Basophils Relative: 1 %
Eosinophils Absolute: 0.5 10*3/uL (ref 0.0–0.5)
Eosinophils Relative: 5 %
HCT: 41.7 % (ref 39.0–52.0)
Hemoglobin: 14.3 g/dL (ref 13.0–17.0)
Immature Granulocytes: 0 %
Lymphocytes Relative: 20 %
Lymphs Abs: 1.9 10*3/uL (ref 0.7–4.0)
MCH: 32.4 pg (ref 26.0–34.0)
MCHC: 34.3 g/dL (ref 30.0–36.0)
MCV: 94.6 fL (ref 80.0–100.0)
Monocytes Absolute: 1.3 10*3/uL — ABNORMAL HIGH (ref 0.1–1.0)
Monocytes Relative: 14 %
Neutro Abs: 5.8 10*3/uL (ref 1.7–7.7)
Neutrophils Relative %: 60 %
Platelets: 224 10*3/uL (ref 150–400)
RBC: 4.41 MIL/uL (ref 4.22–5.81)
RDW: 12.5 % (ref 11.5–15.5)
WBC: 9.6 10*3/uL (ref 4.0–10.5)
nRBC: 0 % (ref 0.0–0.2)

## 2021-01-10 LAB — LACTIC ACID, PLASMA: Lactic Acid, Venous: 0.8 mmol/L (ref 0.5–1.9)

## 2021-01-10 MED ORDER — IOHEXOL 300 MG/ML  SOLN
100.0000 mL | Freq: Once | INTRAMUSCULAR | Status: AC | PRN
  Administered 2021-01-10: 100 mL via INTRAVENOUS

## 2021-01-10 MED ORDER — ACETAMINOPHEN 325 MG PO TABS
650.0000 mg | ORAL_TABLET | Freq: Once | ORAL | Status: AC
  Administered 2021-01-10: 650 mg via ORAL
  Filled 2021-01-10: qty 2

## 2021-01-10 NOTE — ED Triage Notes (Signed)
Pt reports multiple abscesses to the scrotum x1 week and reports spouse helped to "pop" one that was enlarged and leaking on Sunday and since pt has had fever, chills and fatigue. Per pt, area is continuing to drain Cure/yellow discharge with increased swelling and pain. No medication taken today other than regular 81mg  Aspirin this AM.

## 2021-01-11 ENCOUNTER — Emergency Department
Admission: EM | Admit: 2021-01-11 | Discharge: 2021-01-11 | Disposition: A | Discharge status: Home / Self Care | Payer: Self-pay | Attending: Emergency Medicine | Admitting: Emergency Medicine

## 2021-01-11 DIAGNOSIS — L03314 Cellulitis of groin: Secondary | ICD-10-CM

## 2021-01-11 LAB — URINALYSIS, ROUTINE W REFLEX MICROSCOPIC
Bilirubin Urine: NEGATIVE
Glucose, UA: NEGATIVE mg/dL
Hgb urine dipstick: NEGATIVE
Ketones, ur: 5 mg/dL — AB
Leukocytes,Ua: NEGATIVE
Nitrite: NEGATIVE
Protein, ur: NEGATIVE mg/dL
Specific Gravity, Urine: 1.039 — ABNORMAL HIGH (ref 1.005–1.030)
pH: 6 (ref 5.0–8.0)

## 2021-01-11 MED ORDER — VANCOMYCIN HCL IN DEXTROSE 1-5 GM/200ML-% IV SOLN
1000.0000 mg | Freq: Once | INTRAVENOUS | Status: AC
  Administered 2021-01-11: 1000 mg via INTRAVENOUS
  Filled 2021-01-11: qty 200

## 2021-01-11 MED ORDER — SULFAMETHOXAZOLE-TRIMETHOPRIM 800-160 MG PO TABS: 1.0000 | ORAL_TABLET | Freq: Two times a day (BID) | ORAL | 0 refills | Status: AC

## 2021-01-11 MED ORDER — NAPROXEN 500 MG PO TABS: 500.0000 mg | ORAL_TABLET | Freq: Two times a day (BID) | ORAL | 0 refills | Status: AC

## 2021-01-11 MED ORDER — ONDANSETRON HCL 4 MG/2ML IJ SOLN
4.0000 mg | Freq: Once | INTRAMUSCULAR | Status: AC
  Administered 2021-01-11: 4 mg via INTRAVENOUS
  Filled 2021-01-11: qty 2

## 2021-01-11 MED ORDER — CEPHALEXIN 500 MG PO CAPS: 500.0000 mg | ORAL_CAPSULE | Freq: Three times a day (TID) | ORAL | 0 refills | Status: AC

## 2021-01-11 MED ORDER — CLINDAMYCIN PHOSPHATE 600 MG/50ML IV SOLN
600.0000 mg | Freq: Once | INTRAVENOUS | Status: AC
  Administered 2021-01-11: 600 mg via INTRAVENOUS
  Filled 2021-01-11: qty 50

## 2021-01-11 MED ORDER — ONDANSETRON 4 MG PO TBDP: 4.0000 mg | ORAL_TABLET | Freq: Three times a day (TID) | ORAL | 0 refills | Status: AC | PRN

## 2021-01-11 MED ORDER — SODIUM CHLORIDE 0.9 % IV SOLN
1.0000 g | Freq: Once | INTRAVENOUS | Status: AC
  Administered 2021-01-11: 1 g via INTRAVENOUS
  Filled 2021-01-11: qty 1

## 2021-01-11 MED ORDER — MORPHINE SULFATE (PF) 4 MG/ML IV SOLN
4.0000 mg | Freq: Once | INTRAVENOUS | Status: AC
  Administered 2021-01-11: 4 mg via INTRAVENOUS
  Filled 2021-01-11: qty 1

## 2021-01-11 NOTE — ED Provider Notes (Signed)
[facility] Emergency Department Provider Note  ____________________________________________  Time seen: Approximately 9:53 AM  I have reviewed the triage vital signs and the nursing notes.   HISTORY  Chief Complaint Abscess    HPI Dylan Freeman is a 45 y.o. male with a past history of polysubstance abuse who comes ED complaining of swelling and drainage from his scrotum over the past week.  No dysuria.  He does have some fever chills and fatigue over the last few days.  States the area is continuing to drain.  Denies any significant past medical history such as hypertension or diabetes.  No immunosuppressant use.  Reviewed EMR which shows no significant recent medical encounters.  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    Past Medical History:  Diagnosis Date   Cocaine abuse [facility])      Patient Active Problem List   Diagnosis Date Noted   Cocaine abuse (HCC) 11/10/2020   Chest pain 11/04/2020     Past Surgical History:  Procedure Laterality Date   CORONARY/GRAFT ACUTE MI REVASCULARIZATION N/A 11/04/2020   Procedure: Coronary/Graft Acute MI Revascularization;  Surgeon: Marcina Millard, MD;  Location: [facility] INVASIVE CV LAB;  Service: Cardiovascular;  Laterality: N/A;   LEFT HEART CATH AND CORONARY ANGIOGRAPHY N/A 11/04/2020   Procedure: LEFT HEART CATH AND CORONARY ANGIOGRAPHY;  Surgeon: Marcina Millard, MD;  Location: [facility] INVASIVE CV LAB;  Service: Cardiovascular;  Laterality: N/A;     Prior to Admission medications   Medication Sig Start Date End Date Taking? Authorizing Provider  cephALEXin (KEFLEX) 500 MG capsule Take 1 capsule (500 mg total) by mouth 3 (three) times daily. 01/11/21  Yes Sharman Cheek, MD  naproxen (NAPROSYN) 500 MG tablet Take 1 tablet (500 mg total) by mouth 2 (two) times daily with a meal. 01/11/21  Yes Sharman Cheek, MD  ondansetron (ZOFRAN ODT) 4  MG disintegrating tablet Take 1 tablet (4 mg total) by mouth every 8 (eight) hours as needed for nausea or vomiting. 01/11/21  Yes Sharman Cheek, MD  sulfamethoxazole-trimethoprim (BACTRIM DS) 800-160 MG tablet Take 1 tablet by mouth 2 (two) times daily. 01/11/21  Yes Sharman Cheek, MD  aspirin 81 MG chewable tablet Chew 1 tablet (81 mg total) by mouth daily. 11/06/20   Darlin Priestly, MD  methocarbamol (ROBAXIN) 500 MG tablet Take 1 tablet (500 mg total) by mouth every 6 (six) hours as needed for muscle spasms. Home med. 11/05/20   Darlin Priestly, MD  tamsulosin (FLOMAX) 0.4 MG CAPS capsule Take 1 capsule (0.4 mg total) by mouth daily. 01/12/20   Chinita Pester, FNP     Allergies Penicillins   Family History  Problem Relation Age of Onset   Heart attack Mother     Social History Social History   Tobacco Use   Smoking status: Every Day    Types: Cigars   Smokeless tobacco: Never  Substance Use Topics   Alcohol use: Yes    Alcohol/week: 16.0 standard drinks    Types: 16 Standard drinks or equivalent per week   Drug use: Yes    Types: Marijuana, Cocaine    Review of Systems  Constitutional:   No fever or chills.  ENT:   No sore throat. No rhinorrhea. Cardiovascular:   No chest pain or syncope. Respiratory:   No dyspnea or cough. Gastrointestinal:   Negative for abdominal pain, vomiting and diarrhea.  Musculoskeletal:   Negative for focal pain or swelling All other  systems reviewed and are negative except as documented above in ROS and HPI.  ____________________________________________   PHYSICAL EXAM:  VITAL SIGNS: ED Triage Vitals  Enc Vitals Group     BP 01/10/21 2227 137/86     Pulse Rate 01/10/21 2227 85     Resp 01/10/21 2227 20     Temp 01/10/21 2227 (!) 100.9 F (38.3 C)     Temp Source 01/10/21 2227 Oral     SpO2 01/10/21 2227 97 %     Weight 01/10/21 2230 185 lb (83.9 kg)     Height --      Head Circumference --      Peak Flow --      Pain Score --       Pain Loc --      Pain Edu? --      Excl. in GC? --     Vital signs reviewed, nursing assessments reviewed.   Constitutional:   Alert and oriented. Non-toxic appearance. Eyes:   Conjunctivae are normal. EOMI. PERRL. ENT      Head:   Normocephalic and atraumatic.      Nose:   Wearing a mask.      Mouth/Throat:   Wearing a mask.      Neck:   No meningismus. Full ROM. Hematological/Lymphatic/Immunilogical:   No cervical lymphadenopathy. Cardiovascular:   RRR. Symmetric bilateral radial and DP pulses.  No murmurs. Cap refill less than 2 seconds. Respiratory:   Normal respiratory effort without tachypnea/retractions. Breath sounds are clear and equal bilaterally. No wheezes/rales/rhonchi. Gastrointestinal:   Soft and nontender. Non distended. There is no CVA tenderness.  No rebound, rigidity, or guarding. Genitourinary:   No penile discharge.  There is a approximately 3 cm area of erythema tenderness and induration on the right inferior aspect of the scrotum.  There is abrasion of the skin surface, no current purulent drainage, no fluctuance.  No crepitus Musculoskeletal:   Normal range of motion in all extremities. No joint effusions.  No lower extremity tenderness.  No edema. Neurologic:   Normal speech and language.  Motor grossly intact. No acute focal neurologic deficits are appreciated.  Skin:    Skin is warm, dry and intact. No rash noted.  No petechiae, purpura, or bullae.  ____________________________________________    LABS (pertinent positives/negatives) (all labs ordered are listed, but only abnormal results are displayed) Labs Reviewed  COMPREHENSIVE METABOLIC PANEL - Abnormal; Notable for the following components:      Result Value   Glucose, Bld 117 (*)    Calcium 8.5 (*)    Total Bilirubin 1.3 (*)    All other components within normal limits  CBC WITH DIFFERENTIAL/PLATELET - Abnormal; Notable for the following components:   Monocytes Absolute 1.3 (*)    All  other components within normal limits  CULTURE, BLOOD (ROUTINE X 2)  CULTURE, BLOOD (ROUTINE X 2)  LACTIC ACID, PLASMA  PROTIME-INR  CBC WITH DIFFERENTIAL/PLATELET  URINALYSIS, ROUTINE W REFLEX MICROSCOPIC   ____________________________________________   EKG    ____________________________________________    RADIOLOGY  CT Abdomen Pelvis W Contrast  Result Date: 01/11/2021 CLINICAL DATA:  Scrotal swelling.  Concern for Fournier's gangrene. EXAM: CT ABDOMEN AND PELVIS WITH CONTRAST TECHNIQUE: Multidetector CT imaging of the abdomen and pelvis was performed using the standard protocol following bolus administration of intravenous contrast. CONTRAST:  OMNIPAQUE IOHEXOL 300 MG/ML  SOLN COMPARISON:  Scrotal ultrasound dated 01/10/2021. FINDINGS: Lower chest: Small area of ground-glass density at the left lung  base measuring 2.2 cm may represent an area of infarct or septic emboli. Apparent additional tiny focus of ground-glass density at the right lung base (coronal 64/5) as well as additional right lung base ground-glass nodule (36/5) noted. These nodules may be infectious in etiology and concerning for septic emboli. Clinical correlation recommended. Hepatobiliary: A 1 cm left hepatic hypodense lesion is not characterized on this CT. No intrahepatic biliary dilatation. The gallbladder is unremarkable. Pancreas: Unremarkable. No pancreatic ductal dilatation or surrounding inflammatory changes. Spleen: Normal in size without focal abnormality. Adrenals/Urinary Tract: The adrenal glands unremarkable. The kidneys, visualized ureters, and urinary bladder appear unremarkable. Stomach/Bowel: No bowel obstruction or active inflammation. The appendix is normal. Vascular/Lymphatic: The abdominal aorta and IVC are unremarkable. No portal venous gas. There is no adenopathy. Reproductive: The prostate and seminal vesicles are grossly unremarkable. No pelvic mass. There is scrotal wall edema. No drainable  fluid collection or abscess. No soft tissue air or CT evidence of Fournier's gangrene. Other: None Musculoskeletal: No acute or significant osseous findings. IMPRESSION: 1. Scrotal wall edema. No drainable fluid collection or abscess. No soft tissue air or CT evidence of Fournier's gangrene. 2. No bowel obstruction. Normal appendix. 3. Ground-glass nodular densities at the lung bases concerning for septic emboli. Clinical correlation is recommended. Electronically Signed   By: Elgie Collard M.D.   On: 01/11/2021 00:05   US SCROTUM W/DOPPLER  Result Date: 01/10/2021 CLINICAL DATA:  Scrotal swelling, pain EXAM: SCROTAL ULTRASOUND DOPPLER ULTRASOUND OF THE TESTICLES TECHNIQUE: Complete ultrasound examination of the testicles, epididymis, and other scrotal structures was performed. Color and spectral Doppler ultrasound were also utilized to evaluate blood flow to the testicles. COMPARISON:  None. FINDINGS: Right testicle Measurements: 4.7 x 2.0 x 3.1 cm. No mass or microlithiasis visualized. Left testicle Measurements: 4.7 x 2.3 x 2.6 cm. No mass or microlithiasis visualized. Right epididymis:  Normal in size and appearance. Left epididymis:  Normal in size and appearance. Hydrocele:  None visualized. Varicocele:  Possible mild left varicocele. Pulsed Doppler interrogation of both testes demonstrates normal low resistance arterial and venous waveforms bilaterally. IMPRESSION: No evidence of testicular abnormality or torsion. Questionable mild left varicocele. Electronically Signed   By: Charlett Nose M.D.   On: 01/10/2021 23:50    ____________________________________________   PROCEDURES Procedures  ____________________________________________  DIFFERENTIAL DIAGNOSIS   Scrotal abscess, Fournier's gangrene, pelvic abscess, UTI  CLINICAL IMPRESSION / ASSESSMENT AND PLAN / ED COURSE  Medications ordered in the ED: Medications  acetaminophen (TYLENOL) tablet 650 mg (650 mg Oral Given 01/10/21 2257)   iohexol (OMNIPAQUE) 300 MG/ML solution 100 mL (100 mLs Intravenous Contrast Given 01/10/21 2345)  morphine 4 MG/ML injection 4 mg (4 mg Intravenous Given 01/11/21 0606)  ondansetron (ZOFRAN) injection 4 mg (4 mg Intravenous Given 01/11/21 0606)  ceFEPIme (MAXIPIME) 1 g in sodium chloride 0.9 % 100 mL IVPB (0 g Intravenous Stopped 01/11/21 0715)  vancomycin (VANCOCIN) IVPB 1000 mg/200 mL premix (0 mg Intravenous Stopped 01/11/21 0816)  clindamycin (CLEOCIN) IVPB 600 mg (600 mg Intravenous New Bag/Given 01/11/21 0830)    Pertinent labs & imaging results that were available during my care of the patient were reviewed by me and considered in my medical decision making (see chart for details).  Dylan Freeman was evaluated in Emergency Department on 01/11/2021 for the symptoms described in the history of present illness. He was evaluated in the context of the global COVID-19 pandemic, which necessitated consideration that the patient might be at risk for infection with  the SARS-CoV-2 virus that causes COVID-19. Institutional protocols and algorithms that pertain to the evaluation of patients at risk for COVID-19 are in a state of rapid change based on information released by regulatory bodies including the CDC and federal and state organizations. These policies and algorithms were followed during the patient's care in the ED.   Patient presents with scrotal swelling.  Ultrasound of the scrotum is a markable.  CT of the abdomen and pelvis with contrast shows a small area of cellulitis of the scrotal wall without abscess, without evidence of necrotizing fasciitis or deep space infection.  EMR reviewed, a few months ago during hospitalization patient had a hemoglobin A1c which was 4.9.  He is not diabetic.  No other significant medical history.  With this limited cellulitis, he is stable for outpatient management.  Will discharge on Keflex and Bactrim.  He is already received a dose of vancomycin and  cefepime in the ED.      ____________________________________________   FINAL CLINICAL IMPRESSION(S) / ED DIAGNOSES    Final diagnoses:  Cellulitis of groin     ED Discharge Orders          Ordered    cephALEXin (KEFLEX) 500 MG capsule  3 times daily        01/11/21 0947    sulfamethoxazole-trimethoprim (BACTRIM DS) 800-160 MG tablet  2 times daily        01/11/21 0947    ondansetron (ZOFRAN ODT) 4 MG disintegrating tablet  Every 8 hours PRN        01/11/21 0947    naproxen (NAPROSYN) 500 MG tablet  2 times daily with meals        01/11/21 0947            Portions of this note were generated with dragon dictation software. Dictation errors may occur despite best attempts at proofreading.    Sharman Cheek, MD 01/11/21 [id number]

## 2021-01-15 LAB — CULTURE, BLOOD (ROUTINE X 2)
Culture: NO GROWTH
Culture: NO GROWTH

## 2021-09-25 ENCOUNTER — Emergency Department: Payer: Self-pay

## 2021-09-25 ENCOUNTER — Other Ambulatory Visit: Payer: Self-pay

## 2021-09-25 DIAGNOSIS — R0602 Shortness of breath: Secondary | ICD-10-CM | POA: Insufficient documentation

## 2021-09-25 DIAGNOSIS — R072 Precordial pain: Secondary | ICD-10-CM | POA: Insufficient documentation

## 2021-09-25 DIAGNOSIS — Z5321 Procedure and treatment not carried out due to patient leaving prior to being seen by health care provider: Secondary | ICD-10-CM | POA: Insufficient documentation

## 2021-09-25 LAB — BASIC METABOLIC PANEL
Anion gap: 6 (ref 5–15)
BUN: 12 mg/dL (ref 6–20)
CO2: 23 mmol/L (ref 22–32)
Calcium: 8.5 mg/dL — ABNORMAL LOW (ref 8.9–10.3)
Chloride: 108 mmol/L (ref 98–111)
Creatinine, Ser: 0.98 mg/dL (ref 0.61–1.24)
GFR, Estimated: >60 mL/min (ref >60)
Glucose, Bld: 107 mg/dL — ABNORMAL HIGH (ref 70–99)
Potassium: 3.9 mmol/L (ref 3.5–5.1)
Sodium: 137 mmol/L (ref 135–145)

## 2021-09-25 LAB — TROPONIN I (HIGH SENSITIVITY): Troponin I (High Sensitivity): 3 ng/L (ref <18)

## 2021-09-25 LAB — CBC
HCT: 39.4 % (ref 39.0–52.0)
Hemoglobin: 12.4 g/dL — ABNORMAL LOW (ref 13.0–17.0)
MCH: 30 pg (ref 26.0–34.0)
MCHC: 31.5 g/dL (ref 30.0–36.0)
MCV: 95.4 fL (ref 80.0–100.0)
Platelets: 267 10*3/uL (ref 150–400)
RBC: 4.13 MIL/uL — ABNORMAL LOW (ref 4.22–5.81)
RDW: 13.4 % (ref 11.5–15.5)
WBC: 6.4 10*3/uL (ref 4.0–10.5)
nRBC: 0 % (ref 0.0–0.2)

## 2021-09-25 NOTE — ED Triage Notes (Signed)
Pt arrives with c/o chest pain that started yesterday. Pt endorse SOB. Pt denies n/v.

## 2021-09-25 NOTE — ED Provider Triage Note (Signed)
Emergency Medicine Provider Triage Evaluation Note  Dylan Freeman , a 46 y.o. male  was evaluated in triage.  Pt complains of substernal chest pain.  Patient had chest pain beginning yesterday.  States that it is a tightness.  Center of his chest.  No cardiac history.  No shortness of breath, fevers, chills, URI symptoms.  No cough.  Review of Systems  Positive: Chest pain Negative: Fevers, chills, cough, GI complaints  Physical Exam  BP 98/72   Pulse 88   Temp 99.2 F (37.3 C)   Resp 18   Ht 5\' 11"  (1.803 m)   Wt 83.9 kg   SpO2 96%   BMI 25.80 kg/m  Gen:   Awake, no distress   Resp:  Normal effort  MSK:   Moves extremities without difficulty  Other:    Medical Decision Making  Medically screening exam initiated at 9:51 PM.  Appropriate orders placed.  [place] was informed that the remainder of the evaluation will be completed by another provider, this initial triage assessment does not replace that evaluation, and the importance of remaining in the ED until their evaluation is complete.  Patient presents with chest pain.  Will order labs, EKG, chest x-ray   Chilton Si, PA-C 09/25/21 2152

## 2021-09-26 ENCOUNTER — Emergency Department
Admission: EM | Admit: 2021-09-26 | Discharge: 2021-09-26 | Discharge status: Against Medical Advice | Payer: Self-pay | Attending: Emergency Medicine | Admitting: Emergency Medicine

## 2021-09-26 LAB — TROPONIN I (HIGH SENSITIVITY): Troponin I (High Sensitivity): 4 ng/L (ref <18)

## 2021-09-26 NOTE — ED Notes (Signed)
No answer when called several times from lobby 

## 2021-10-17 ENCOUNTER — Ambulatory Visit: Payer: Self-pay | Admitting: Nurse Practitioner

## 2021-10-17 ENCOUNTER — Encounter: Payer: Self-pay | Admitting: Nurse Practitioner

## 2021-10-17 DIAGNOSIS — Z202 Contact with and (suspected) exposure to infections with a predominantly sexual mode of transmission: Secondary | ICD-10-CM

## 2021-10-17 DIAGNOSIS — Z113 Encounter for screening for infections with a predominantly sexual mode of transmission: Secondary | ICD-10-CM

## 2021-10-17 LAB — HEPATITIS B SURFACE ANTIGEN: Hepatitis B Surface Ag: NONREACTIVE

## 2021-10-17 MED ORDER — DOXYCYCLINE HYCLATE 100 MG PO TABS: 100.0000 mg | ORAL_TABLET | Freq: Two times a day (BID) | ORAL | 0 refills | Status: AC

## 2021-10-17 NOTE — Progress Notes (Signed)
[facility] Department STI clinic/screening visit  Subjective:  Dylan Freeman is a 46 y.o. male being seen today for an STI screening visit. The patient reports they do have symptoms.    Patient has the following medical conditions:   Patient Active Problem List   Diagnosis Date Noted   Cocaine abuse (HCC) 11/10/2020   Chest pain 11/04/2020     Chief Complaint  Patient presents with   SEXUALLY TRANSMITTED DISEASE    Screening- patient stated "he was exposed to syphilis"     HPI  Patient reports to clinic for STD screening.  Patient reports sore throat that started a couple of days ago.  Patient also states that he has a history of eczema and has some genital itching that he believes is caused by his eczema.  Patient also report being a contact to Syphilis.    Does the patient or their partner desires a pregnancy in the next year? No  Screening for MPX risk: Does the patient have an unexplained rash? No Is the patient MSM? No Does the patient endorse multiple sex partners or anonymous sex partners? No Did the patient have close or sexual contact with a person diagnosed with MPX? No Has the patient traveled outside the [place] where MPX is endemic? No Is there a high clinical suspicion for MPX-- evidenced by one of the following No  -Unlikely to be chickenpox  -Lymphadenopathy  -Rash that present in same phase of evolution on any given body part   See flowsheet for further details and programmatic requirements.    There is no immunization history on file for this patient.   The following portions of the patient's history were reviewed and updated as appropriate: allergies, current medications, past medical history, past social history, past surgical history and problem list.  Objective:  There were no vitals filed for this visit.  Physical Exam Constitutional:      Appearance: Normal appearance.  HENT:     Head: Normocephalic. No abrasion, masses or  laceration. Hair is normal.     Right Ear: External ear normal.     Left Ear: External ear normal.     Nose: Nose normal.     Mouth/Throat:     Lips: Pink.     Mouth: Mucous membranes are moist. No oral lesions.     Pharynx: No pharyngeal swelling, oropharyngeal exudate, posterior oropharyngeal erythema or uvula swelling.     Tonsils: No tonsillar exudate or tonsillar abscesses.     Comments: Poor dentition  Eyes:     General: Lids are normal.        Right eye: No discharge.        Left eye: No discharge.     Conjunctiva/sclera: Conjunctivae normal.     Right eye: No exudate.    Left eye: No exudate. Abdominal:     General: Abdomen is flat.     Palpations: Abdomen is soft.     Tenderness: There is no abdominal tenderness. There is no rebound.  Genitourinary:    Pubic Area: No rash or pubic lice.      Penis: Normal and circumcised. No erythema or discharge.      Testes: Normal.        Right: Mass or tenderness not present.        Left: Mass or tenderness not present.     Rectum: Normal.       Comments: 1 x 1 cm scabbed hypopigmented lesion noted to left scrotum.  Musculoskeletal:     Cervical back: Full passive range of motion without pain, normal range of motion and neck supple.  Lymphadenopathy:     Cervical: No cervical adenopathy.     Right cervical: No superficial, deep or posterior cervical adenopathy.    Left cervical: No superficial, deep or posterior cervical adenopathy.     Upper Body:     Right upper body: No supraclavicular, axillary or epitrochlear adenopathy.     Left upper body: No supraclavicular, axillary or epitrochlear adenopathy.     Lower Body: No right inguinal adenopathy. No left inguinal adenopathy.  Skin:    General: Skin is warm and dry.     Findings: No lesion or rash.  Neurological:     Mental Status: He is alert and oriented to person, place, and time.  Psychiatric:        Attention and Perception: Attention normal.        Mood and Affect:  Mood normal.        Speech: Speech normal.        Behavior: Behavior normal. Behavior is cooperative.       Assessment and Plan:  Chevis Weisensel is a 46 y.o. male presenting to the [facility] Department for STI screening  1. Screening examination for venereal disease -46 year old male in clinic today for STD screening and treatment as a contact to Syphilis.   -Patient does have STI symptoms Patient accepted all screenings including oral GC, urine CT/GC and bloodwork for HIV/RPR.  Patient meets criteria for HepB screening? Yes. Ordered? Yes Patient meets criteria for HepC screening? Yes. Ordered? Yes Recommended condom use with all sex Discussed importance of condom use for STI prevent   Discussed time line for State Lab results and that patient will be called with positive results and encouraged patient to call if he had not heard in 2 weeks Recommended returning for continued or worsening symptoms.    - HIV/HCV [place] [facility] - Syphilis Serology, [place] [facility] - HBV Antigen/Antibody State Lab - Gonococcus culture - Chlamydia/GC NAA, Confirmation  2. Exposure to syphilis -Treat as a contact to Syphilis.  - doxycycline (VIBRA-TABS) 100 MG tablet; Take 1 tablet (100 mg total) by mouth 2 (two) times daily.  Dispense: 28 tablet; Refill: 0  Total time spent: 30 minutes    Return if symptoms worsen or fail to improve.  Future Appointments  Date Time Provider Department Center  10/24/2021  1:00 PM [facility] PROVIDER [facility] None    Glenna Fellows, FNP

## 2021-10-17 NOTE — Progress Notes (Signed)
Pt here for STD screening and as a contact to Syphilis.  The patient was dispensed Doxycycline 100 mg #28  today. I provided counseling today regarding the medication. We discussed the medication, the side effects and when to call clinic. Patient given the opportunity to ask questions. Questions answered.  Condoms declined.  Berdie Ogren, RN

## 2021-10-20 LAB — CHLAMYDIA/GC NAA, CONFIRMATION
Chlamydia trachomatis, NAA: NEGATIVE
Neisseria gonorrhoeae, NAA: NEGATIVE

## 2021-10-22 LAB — GONOCOCCUS CULTURE

## 2021-10-24 ENCOUNTER — Ambulatory Visit: Payer: Self-pay

## 2021-10-24 ENCOUNTER — Telehealth: Payer: Self-pay

## 2021-10-24 LAB — HM HEPATITIS C SCREENING LAB: HM Hepatitis Screen: POSITIVE

## 2021-10-24 NOTE — Telephone Encounter (Signed)
Attempted to contact pt regarding HIV/Hep C test results that were drawn on 10/17/21. No answer. LMTRC.Hep C results: Anti-HCV Antibody reactive . This indicates prior exposure to Hepatitis C with clearance of infection either due to prior treatment or immune response. Individuals are considered uninfected, but are not protected from repeat Hepatitis C infection.

## 2021-10-25 ENCOUNTER — Telehealth: Payer: Self-pay

## 2021-10-25 NOTE — Telephone Encounter (Signed)
Received return phone call from pt. Verified info. Results given HIV-negative, Hep B -negative and went over Hep C results . Counseled pt. Pt states he did get a new ear piercing on June 27th of this year ,at [facility] in [place]. No IV drug use and no recent incarcerations. Advised pt this does not mean he is protected from repeat Hep C infection. Pt verbalizes understanding.

## 2021-10-26 ENCOUNTER — Telehealth: Payer: Self-pay

## 2021-10-26 NOTE — Telephone Encounter (Signed)
Syphilis Serology - [facility] Collected: 10/17/2021 RPR Qualitative:  Reactive RPR Quantitative:  1:256 Syphilis TP: Reactive  Symptoms: lesion present Contact to Syphilis: Yes  Treatment given 10/17/2021 Doxycycline 100 mg, Twice a Day for 14 days.   Attempted phone call to discuss lab results.  Left message on voice mail to return call.

## 2021-10-30 DIAGNOSIS — Z205 Contact with and (suspected) exposure to viral hepatitis: Secondary | ICD-10-CM | POA: Insufficient documentation

## 2022-01-29 IMAGING — DX DG CHEST 1V PORT
1 series · 1 of 1 positions shown · non-contrast
Comparison: None.

CLINICAL DATA: Chest pain

EXAM:
PORTABLE CHEST 1 VIEW

[chest ap]
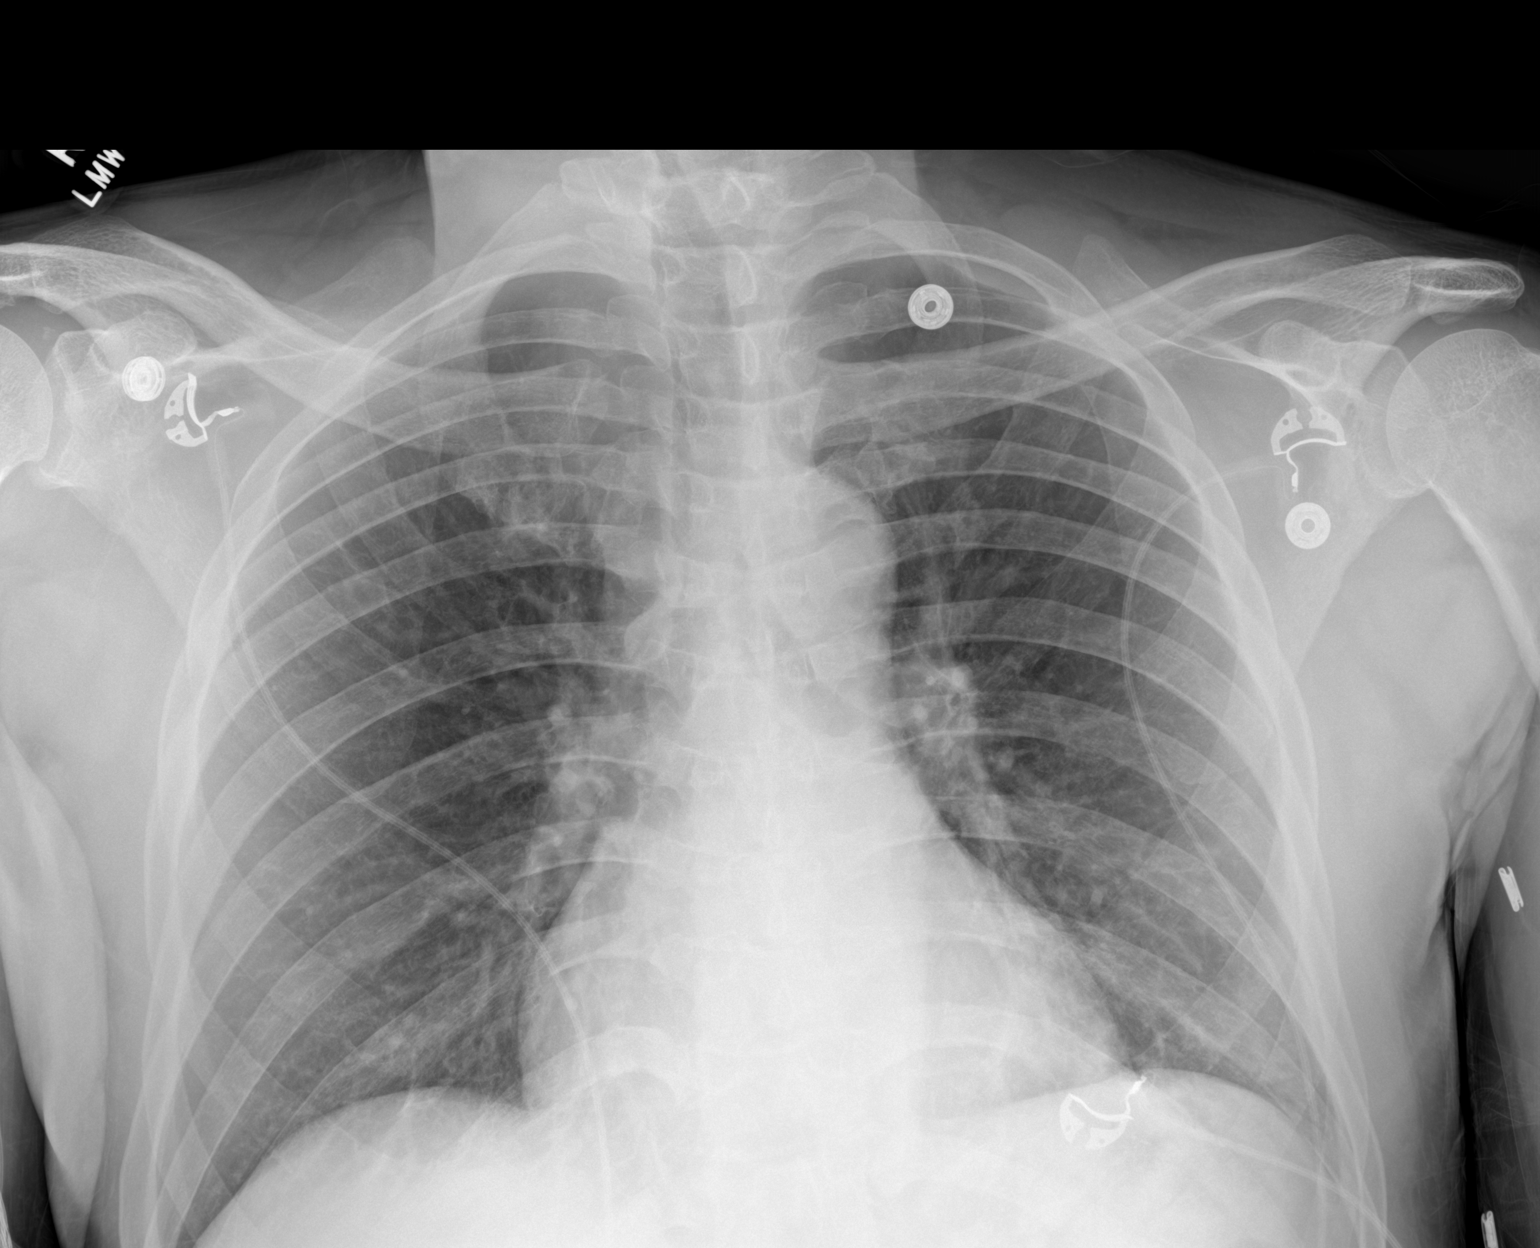

[1 of 1 positions shown; findings below may reference images not displayed]

FINDINGS: The heart size and mediastinal contours are within normal limits.
Both lungs are clear. The visualized skeletal structures are
unremarkable.
IMPRESSION: No active disease.
